# Patient Record
Sex: Female | Born: 1955 | Race: Black or African American | Hispanic: No | Smoking: Never smoker
Health system: Southern US, Community
[De-identification: ages and names within clinical notes are randomized; demographics above are authoritative.]

## PROBLEM LIST (undated history)

## (undated) DIAGNOSIS — E119 Type 2 diabetes mellitus without complications: Secondary | ICD-10-CM

---

## 2017-08-19 ENCOUNTER — Ambulatory Visit (INDEPENDENT_AMBULATORY_CARE_PROVIDER_SITE_OTHER): Payer: BLUE CROSS/BLUE SHIELD

## 2017-08-19 ENCOUNTER — Encounter (HOSPITAL_COMMUNITY): Payer: Self-pay | Admitting: Emergency Medicine

## 2017-08-19 ENCOUNTER — Ambulatory Visit (HOSPITAL_COMMUNITY)
Admission: EM | Admit: 2017-08-19 | Discharge: 2017-08-19 | Disposition: A | Discharge status: Home / Self Care | Payer: BLUE CROSS/BLUE SHIELD | Attending: Family Medicine | Admitting: Family Medicine

## 2017-08-19 DIAGNOSIS — W19XXXA Unspecified fall, initial encounter: Secondary | ICD-10-CM

## 2017-08-19 DIAGNOSIS — S300XXA Contusion of lower back and pelvis, initial encounter: Secondary | ICD-10-CM

## 2017-08-19 HISTORY — DX: Type 2 diabetes mellitus without complications: E11.9

## 2017-08-19 NOTE — Discharge Instructions (Signed)
The x-ray does not show any broken bottles or displacement of bones. It is likely that you have a bad bruise to the area of pain and tenderness. Place ice on this area off and on for the next 2 days. He may take Tylenol for pain and if you are able ibuprofen for pain. Make sure you are sitting on something soft. If you are not getting better in the next few days follow-up with your primary care doctor. If you start losing control of your bowel or bladder also follow up with your doctor or go to the emergency department.

## 2017-08-19 NOTE — ED Provider Notes (Signed)
[facility]    CSN: [id number] Arrival date & time: 08/19/17  1817     History   Chief Complaint Chief Complaint  Patient presents with  . Fall    HPI Yvonne Calhoun is a 61 y.o. female.   61 year old obese female states that she fell on her left buttock around 2 PM this afternoon. She is complaining of pain primarily to the left posterior buttock. She has been able to rise to a standing position with assistance and "hobble" short distances due to the pain. Denies pain or tenderness to the left lateral hip. Denies pain or injury to the head, neck, back, abdomen, chest or other extremities.      Past Medical History:  Diagnosis Date  . Diabetes mellitus without complication (HCC)     There are no active problems to display for this patient.   History reviewed. No pertinent surgical history.  OB History    No data available       Home Medications    Prior to Admission medications   Medication Sig Start Date End Date Taking? Authorizing Provider  glipiZIDE (GLUCOTROL) 5 MG tablet Take by mouth daily before breakfast.   Yes [provider]    Family History History reviewed. No pertinent family history.  Social History Social History  Substance Use Topics  . Smoking status: Never Smoker  . Smokeless tobacco: Never Used  . Alcohol use No     Allergies   Patient has no known allergies.   Review of Systems Review of Systems  Constitutional: Negative for activity change, chills and fever.  HENT: Negative.   Respiratory: Negative.   Cardiovascular: Negative.   Musculoskeletal:       As per HPI  Skin: Negative for color change, pallor and rash.  Neurological: Negative.      Physical Exam Triage Vital Signs ED Triage Vitals  Enc Vitals Group     BP 08/19/17 1852 (!) 141/97     Pulse Rate 08/19/17 1852 90     Resp 08/19/17 1852 18     Temp 08/19/17 1852 97.7 F (36.5 C)     Temp Source 08/19/17 1852 Oral     SpO2 08/19/17  1852 95 %     Weight --      Height --      Head Circumference --      Peak Flow --      Pain Score 08/19/17 1853 9     Pain Loc --      Pain Edu? --      Excl. in GC? --    No data found.   Updated Vital Signs BP (!) 141/97 (BP Location: Right Arm)   Pulse 90   Temp 97.7 F (36.5 C) (Oral)   Resp 18   LMP  (Exact Date)   SpO2 95%   Visual Acuity Right Eye Distance:   Left Eye Distance:   Bilateral Distance:    Right Eye Near:   Left Eye Near:    Bilateral Near:     Physical Exam  Constitutional: She is oriented to person, place, and time. She appears well-developed and well-nourished. No distress.  HENT:  Head: Normocephalic and atraumatic.  Eyes: Pupils are equal, round, and reactive to light. EOM are normal.  Neck: Normal range of motion. Neck supple.  Cardiovascular: Normal rate.   Pulmonary/Chest: Effort normal. She exhibits no tenderness.  Musculoskeletal: She exhibits no edema or deformity.  Tenderness to the left buttock  with greatest amount of tenderness to the lower lateral gluteal cleft near the issue. No lateral hip tenderness. Able stand and bear weight on both extremities. No tenderness to the spine or back.  Neurological: She is alert and oriented to person, place, and time. No cranial nerve deficit.  Skin: Skin is warm and dry. Capillary refill takes less than 2 seconds.  Psychiatric: She has a normal mood and affect.  Nursing note and vitals reviewed.    UC Treatments / Results  Labs (all labs ordered are listed, but only abnormal results are displayed) Labs Reviewed - No data to display  EKG  EKG Interpretation None       Radiology Dg Pelvis 1-2 Views  Result Date: 08/19/2017 CLINICAL DATA:  Slip and fall injury on a wet floor mat. EXAM: PELVIS - 1-2 VIEW COMPARISON:  None. FINDINGS: The study is limited in bone detail, but negative for displaced fracture of the bony pelvis or hips. Grossly intact pubic symphysis and sacroiliac joints.  IMPRESSION: Negative limited study. Electronically Signed   By: Ellery Plunk M.D.   On: 08/19/2017 19:54    Procedures Procedures (including critical care time)  Medications Ordered in UC Medications - No data to display   Initial Impression / Assessment and Plan / UC Course  I have reviewed the triage vital signs and the nursing notes.  Pertinent labs & imaging results that were available during my care of the patient were reviewed by me and considered in my medical decision making (see chart for details).    The x-ray does not show any broken bottles or displacement of bones. It is likely that you have a bad bruise to the area of pain and tenderness. Place ice on this area off and on for the next 2 days. He may take Tylenol for pain and if you are able ibuprofen for pain. Make sure you are sitting on something soft. If you are not getting better in the next few days follow-up with your primary care doctor. If you start losing control of your bowel or bladder also follow up with your doctor or go to the emergency department.    Final Clinical Impressions(s) / UC Diagnoses   Final diagnoses:  Fall, initial encounter  Contusion of buttock, initial encounter  Pelvic contusion, initial encounter    New Prescriptions New Prescriptions   No medications on file     Controlled Substance Prescriptions [place] Controlled Substance Registry consulted? Not Applicable   Hayden Rasmussen, NP 08/19/17 2004

## 2017-08-19 NOTE — ED Triage Notes (Signed)
Pt here with slip and fall today; pt sts right sided buttocks and upper leg pain; pt sts was ambulatory with limp

## 2018-11-19 IMAGING — DX DG PELVIS 1-2V
1 series · 1 of 1 positions shown · non-contrast
Comparison: None.

CLINICAL DATA: Slip and fall injury on a wet floor mat.

EXAM:
PELVIS - 1-2 VIEW

[pelvis ap]
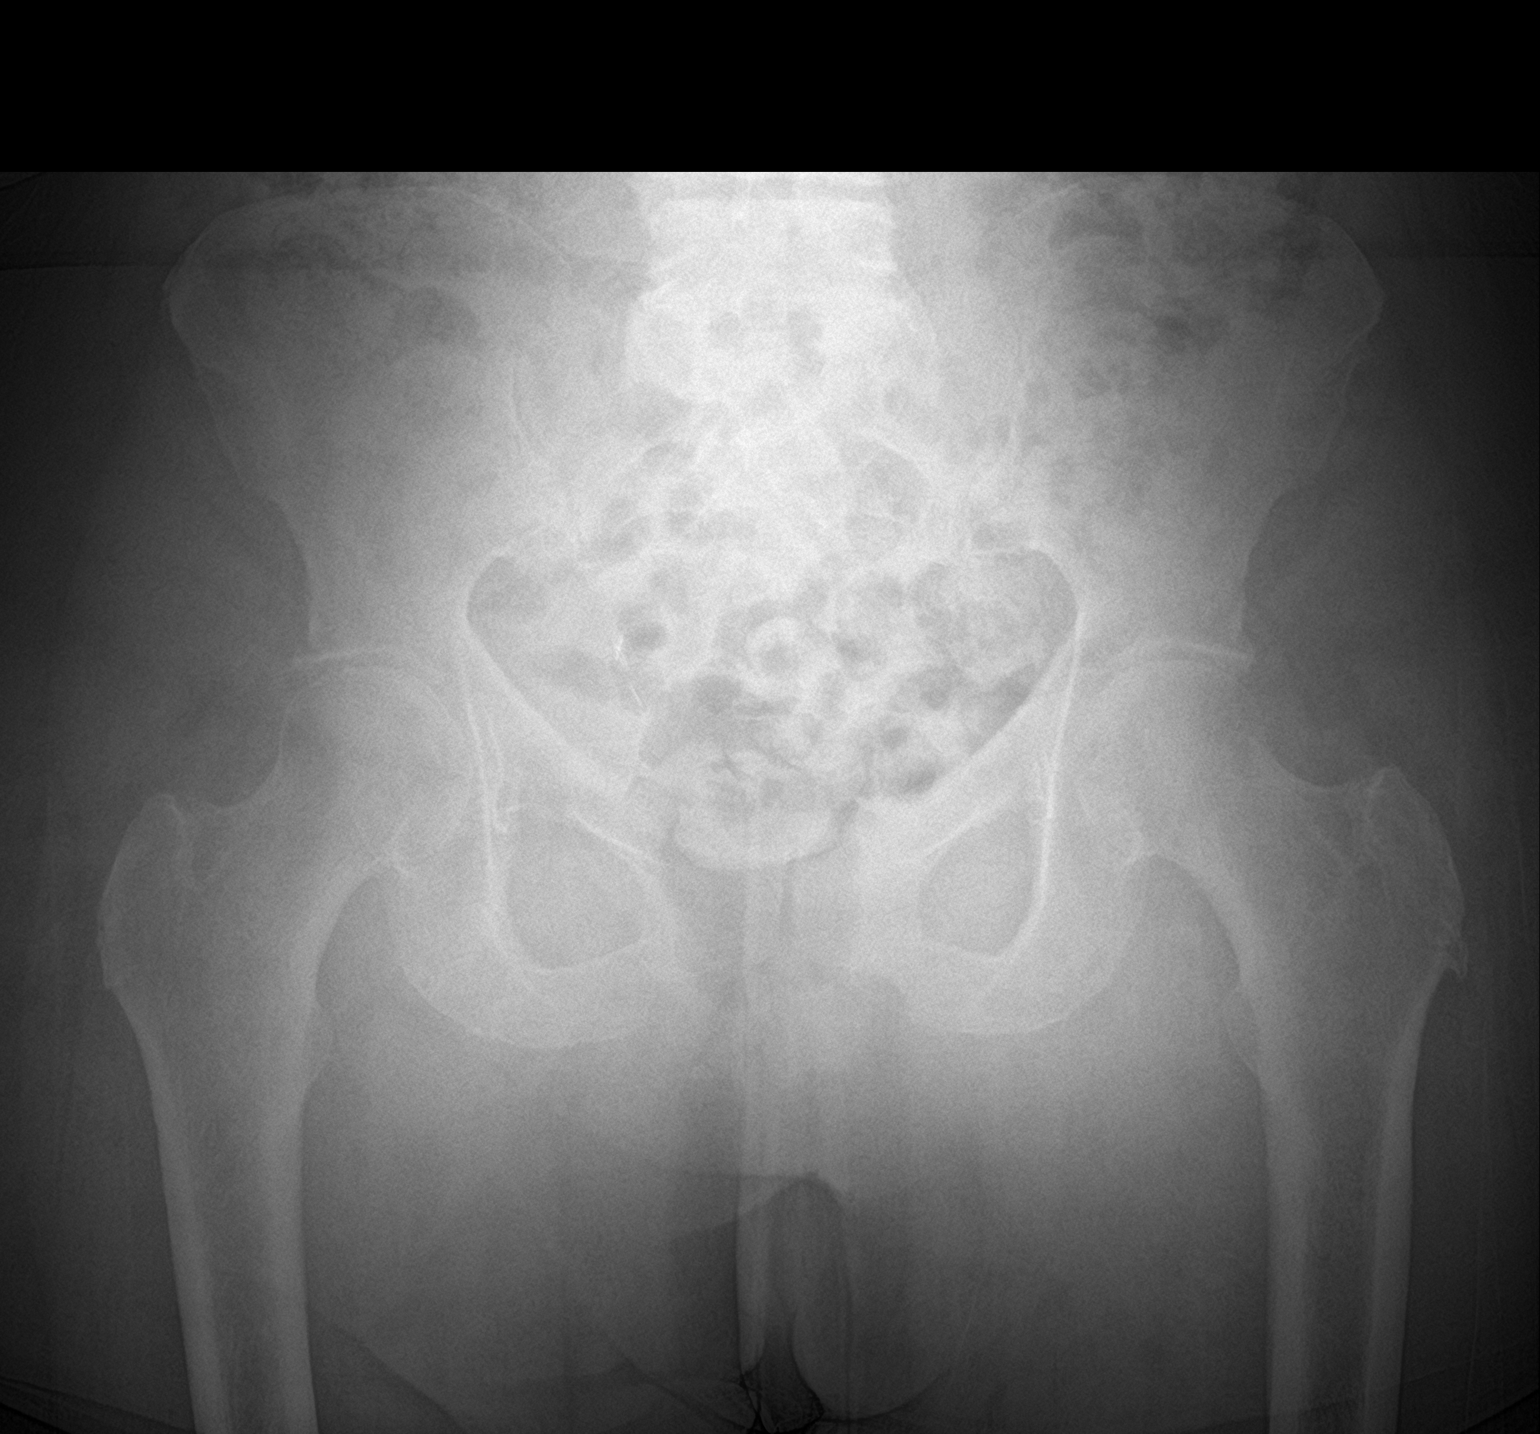

[1 of 1 positions shown; findings below may reference images not displayed]

FINDINGS: The study is limited in bone detail, but negative for displaced
fracture of the bony pelvis or hips. Grossly intact pubic symphysis
and sacroiliac joints.
IMPRESSION: Negative limited study.
